# Patient Record
Sex: Male | Born: 1995 | Race: White | Hispanic: No | Marital: Single | Smoking: Current every day smoker
Health system: Southern US, Community
[De-identification: ages and names within clinical notes are randomized; demographics above are authoritative.]

## PROBLEM LIST (undated history)

## (undated) DIAGNOSIS — F909 Attention-deficit hyperactivity disorder, unspecified type: Secondary | ICD-10-CM

---
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
## 2019-02-14 ENCOUNTER — Other Ambulatory Visit: Payer: Self-pay

## 2019-02-14 ENCOUNTER — Encounter (HOSPITAL_COMMUNITY): Payer: Self-pay | Admitting: Emergency Medicine

## 2019-02-14 ENCOUNTER — Emergency Department (HOSPITAL_COMMUNITY)
Admission: EM | Admit: 2019-02-14 | Discharge: 2019-02-15 | Disposition: A | Discharge status: Home / Self Care | Payer: Self-pay | Attending: Emergency Medicine | Admitting: Emergency Medicine

## 2019-02-14 DIAGNOSIS — S3991XA Unspecified injury of abdomen, initial encounter: Secondary | ICD-10-CM | POA: Insufficient documentation

## 2019-02-14 DIAGNOSIS — E876 Hypokalemia: Secondary | ICD-10-CM

## 2019-02-14 DIAGNOSIS — Y929 Unspecified place or not applicable: Secondary | ICD-10-CM | POA: Insufficient documentation

## 2019-02-14 DIAGNOSIS — Z79899 Other long term (current) drug therapy: Secondary | ICD-10-CM | POA: Insufficient documentation

## 2019-02-14 DIAGNOSIS — F909 Attention-deficit hyperactivity disorder, unspecified type: Secondary | ICD-10-CM | POA: Insufficient documentation

## 2019-02-14 DIAGNOSIS — Y999 Unspecified external cause status: Secondary | ICD-10-CM | POA: Insufficient documentation

## 2019-02-14 DIAGNOSIS — F10920 Alcohol use, unspecified with intoxication, uncomplicated: Secondary | ICD-10-CM

## 2019-02-14 DIAGNOSIS — F17210 Nicotine dependence, cigarettes, uncomplicated: Secondary | ICD-10-CM | POA: Insufficient documentation

## 2019-02-14 DIAGNOSIS — S299XXA Unspecified injury of thorax, initial encounter: Secondary | ICD-10-CM | POA: Insufficient documentation

## 2019-02-14 DIAGNOSIS — Y9389 Activity, other specified: Secondary | ICD-10-CM | POA: Insufficient documentation

## 2019-02-14 DIAGNOSIS — Z23 Encounter for immunization: Secondary | ICD-10-CM | POA: Insufficient documentation

## 2019-02-14 DIAGNOSIS — S0083XA Contusion of other part of head, initial encounter: Secondary | ICD-10-CM
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
## 2019-02-15 ENCOUNTER — Emergency Department (HOSPITAL_COMMUNITY): Payer: Self-pay

## 2019-02-15 LAB — ETHANOL: Alcohol, Ethyl (B): 173 mg/dL — ABNORMAL HIGH (ref <10)

## 2019-02-15 LAB — COMPREHENSIVE METABOLIC PANEL
ALT: 18 U/L (ref 0–44)
AST: 29 U/L (ref 15–41)
Albumin: 4.1 g/dL (ref 3.5–5.0)
Alkaline Phosphatase: 56 U/L (ref 38–126)
Anion gap: 17 — ABNORMAL HIGH (ref 5–15)
BUN: 12 mg/dL (ref 6–20)
CO2: 18 mmol/L — ABNORMAL LOW (ref 22–32)
Calcium: 8.8 mg/dL — ABNORMAL LOW (ref 8.9–10.3)
Chloride: 101 mmol/L (ref 98–111)
Creatinine, Ser: 1.22 mg/dL (ref 0.61–1.24)
GFR calc Af Amer: >60 mL/min (ref >60)
GFR calc non Af Amer: >60 mL/min (ref >60)
GLUCOSE: 154 mg/dL — AB (ref 70–99)
Potassium: 2.7 mmol/L — CL (ref 3.5–5.1)
Sodium: 136 mmol/L (ref 135–145)
TOTAL PROTEIN: 6.9 g/dL (ref 6.5–8.1)
Total Bilirubin: 0.9 mg/dL (ref 0.3–1.2)

## 2019-02-15 LAB — CBC
HCT: 44.7 % (ref 39.0–52.0)
Hemoglobin: 15 g/dL (ref 13.0–17.0)
MCH: 28.4 pg (ref 26.0–34.0)
MCHC: 33.6 g/dL (ref 30.0–36.0)
MCV: 84.5 fL (ref 80.0–100.0)
Platelets: 191 10*3/uL (ref 150–400)
RBC: 5.29 MIL/uL (ref 4.22–5.81)
RDW: 14.6 % (ref 11.5–15.5)
WBC: 16.2 10*3/uL — ABNORMAL HIGH (ref 4.0–10.5)
nRBC: 0 % (ref 0.0–0.2)

## 2019-02-15 LAB — URINALYSIS, ROUTINE W REFLEX MICROSCOPIC
Bacteria, UA: NONE SEEN
Bilirubin Urine: NEGATIVE
Glucose, UA: NEGATIVE mg/dL
Ketones, ur: NEGATIVE mg/dL
Leukocytes,Ua: NEGATIVE
Nitrite: NEGATIVE
Protein, ur: NEGATIVE mg/dL
Specific Gravity, Urine: 1.029 (ref 1.005–1.030)
pH: 5 (ref 5.0–8.0)

## 2019-02-15 LAB — PROTIME-INR
INR: 1.1 (ref 0.8–1.2)
Prothrombin Time: 14 seconds (ref 11.4–15.2)

## 2019-02-15 LAB — RAPID URINE DRUG SCREEN, HOSP PERFORMED
Amphetamines: NOT DETECTED
BENZODIAZEPINES: NOT DETECTED
Barbiturates: NOT DETECTED
Cocaine: NOT DETECTED
Opiates: NOT DETECTED
Tetrahydrocannabinol: POSITIVE — AB

## 2019-02-15 LAB — LACTIC ACID, PLASMA: Lactic Acid, Venous: 4.1 mmol/L (ref 0.5–1.9)

## 2019-02-15 LAB — CDS SEROLOGY

## 2019-02-15 MED ORDER — CLINDAMYCIN PHOSPHATE 600 MG/50ML IV SOLN
600.0000 mg | Freq: Once | INTRAVENOUS | Status: AC
  Administered 2019-02-15: 600 mg via INTRAVENOUS
  Filled 2019-02-15: qty 50

## 2019-02-15 MED ORDER — MAGNESIUM SULFATE 2 GM/50ML IV SOLN
2.0000 g | Freq: Once | INTRAVENOUS | Status: AC
  Administered 2019-02-15: 2 g via INTRAVENOUS
  Filled 2019-02-15: qty 50

## 2019-02-15 MED ORDER — KETOROLAC TROMETHAMINE 15 MG/ML IJ SOLN
15.0000 mg | Freq: Once | INTRAMUSCULAR | Status: DC
  Filled 2019-02-15: qty 1

## 2019-02-15 MED ORDER — POTASSIUM CHLORIDE CRYS ER 20 MEQ PO TBCR: 20.0000 meq | EXTENDED_RELEASE_TABLET | Freq: Two times a day (BID) | ORAL | 0 refills | Status: AC

## 2019-02-15 MED ORDER — SODIUM CHLORIDE 0.9 % IV BOLUS
4000.0000 mL | Freq: Once | INTRAVENOUS | Status: AC
  Administered 2019-02-15: 4000 mL via INTRAVENOUS

## 2019-02-15 MED ORDER — IOHEXOL 300 MG/ML  SOLN
100.0000 mL | Freq: Once | INTRAMUSCULAR | Status: AC | PRN
  Administered 2019-02-15: 100 mL via INTRAVENOUS

## 2019-02-15 MED ORDER — TETANUS-DIPHTH-ACELL PERTUSSIS 5-2.5-18.5 LF-MCG/0.5 IM SUSP
0.5000 mL | Freq: Once | INTRAMUSCULAR | Status: AC
  Administered 2019-02-15: 0.5 mL via INTRAMUSCULAR
  Filled 2019-02-15: qty 0.5

## 2019-02-15 MED ORDER — NALOXONE HCL 2 MG/2ML IJ SOSY
1.0000 mg | PREFILLED_SYRINGE | Freq: Once | INTRAMUSCULAR | Status: DC
  Filled 2019-02-15: qty 2

## 2019-02-15 MED ORDER — POTASSIUM CHLORIDE CRYS ER 20 MEQ PO TBCR
120.0000 meq | EXTENDED_RELEASE_TABLET | Freq: Once | ORAL | Status: DC
  Filled 2019-02-15: qty 6

## 2019-02-15 MED ORDER — IBUPROFEN 800 MG PO TABS: 800.0000 mg | ORAL_TABLET | Freq: Three times a day (TID) | ORAL | 0 refills | Status: AC
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
## 2019-02-15 NOTE — ED Notes (Signed)
Patient verbalizes understanding of discharge instructions. Opportunity for questioning and answers were provided. Armband removed by staff, pt discharged from ED ambulatory and escorted by security
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
